# Patient Record
Sex: Female | Born: 1995 | Race: White | Hispanic: No | Smoking: Never smoker
Health system: Southern US, Community
[De-identification: ages and names within clinical notes are randomized; demographics above are authoritative.]

---
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
## 2023-05-20 DIAGNOSIS — J069 Acute upper respiratory infection, unspecified: Secondary | ICD-10-CM | POA: Diagnosis not present

## 2023-10-13 DIAGNOSIS — L821 Other seborrheic keratosis: Secondary | ICD-10-CM | POA: Diagnosis not present

## 2023-10-13 DIAGNOSIS — D225 Melanocytic nevi of trunk: Secondary | ICD-10-CM | POA: Diagnosis not present

## 2023-10-13 DIAGNOSIS — L578 Other skin changes due to chronic exposure to nonionizing radiation: Secondary | ICD-10-CM | POA: Diagnosis not present

## 2023-10-13 DIAGNOSIS — L814 Other melanin hyperpigmentation: Secondary | ICD-10-CM | POA: Diagnosis not present

## 2024-01-16 DIAGNOSIS — S61313A Laceration without foreign body of left middle finger with damage to nail, initial encounter: Secondary | ICD-10-CM | POA: Diagnosis not present

## 2024-03-22 DIAGNOSIS — R7301 Impaired fasting glucose: Secondary | ICD-10-CM | POA: Diagnosis not present

## 2024-03-22 DIAGNOSIS — N920 Excessive and frequent menstruation with regular cycle: Secondary | ICD-10-CM | POA: Diagnosis not present

## 2024-03-22 DIAGNOSIS — Z23 Encounter for immunization: Secondary | ICD-10-CM | POA: Diagnosis not present

## 2024-03-22 DIAGNOSIS — Z0000 Encounter for general adult medical examination without abnormal findings: Secondary | ICD-10-CM | POA: Diagnosis not present

## 2024-04-26 DIAGNOSIS — Z23 Encounter for immunization: Secondary | ICD-10-CM | POA: Diagnosis not present
